# Patient Record
Sex: Female | Born: 1950 | Race: White | Hispanic: No | Smoking: Never smoker
Health system: Southern US, Community
[De-identification: ages and names within clinical notes are randomized; demographics above are authoritative.]

## PROBLEM LIST (undated history)

## (undated) DIAGNOSIS — C801 Malignant (primary) neoplasm, unspecified: Secondary | ICD-10-CM

## (undated) DIAGNOSIS — I10 Essential (primary) hypertension: Secondary | ICD-10-CM

## (undated) HISTORY — PX: BREAST SURGERY: SHX581

## (undated) HISTORY — PX: ABDOMINAL HYSTERECTOMY: SHX81

---

## 2013-01-08 ENCOUNTER — Encounter (HOSPITAL_BASED_OUTPATIENT_CLINIC_OR_DEPARTMENT_OTHER): Payer: Self-pay

## 2013-01-08 ENCOUNTER — Emergency Department (HOSPITAL_BASED_OUTPATIENT_CLINIC_OR_DEPARTMENT_OTHER)
Admission: EM | Admit: 2013-01-08 | Discharge: 2013-01-08 | Disposition: A | Discharge status: Home / Self Care | Payer: BC Managed Care – PPO | Attending: Emergency Medicine | Admitting: Emergency Medicine

## 2013-01-08 DIAGNOSIS — Z79899 Other long term (current) drug therapy: Secondary | ICD-10-CM | POA: Insufficient documentation

## 2013-01-08 DIAGNOSIS — I10 Essential (primary) hypertension: Secondary | ICD-10-CM | POA: Insufficient documentation

## 2013-01-08 DIAGNOSIS — F411 Generalized anxiety disorder: Secondary | ICD-10-CM | POA: Insufficient documentation

## 2013-01-08 DIAGNOSIS — Z85819 Personal history of malignant neoplasm of unspecified site of lip, oral cavity, and pharynx: Secondary | ICD-10-CM | POA: Insufficient documentation

## 2013-01-08 HISTORY — DX: Essential (primary) hypertension: I10

## 2013-01-08 HISTORY — DX: Malignant (primary) neoplasm, unspecified: C80.1

## 2013-01-08 LAB — BASIC METABOLIC PANEL
CO2: 28 mEq/L (ref 19–32)
Calcium: 10.3 mg/dL (ref 8.4–10.5)
Creatinine, Ser: 0.6 mg/dL (ref 0.50–1.10)

## 2013-01-08 LAB — TROPONIN I: Troponin I: <0.3 ng/mL (ref <0.30)

## 2013-01-08 LAB — CBC WITH DIFFERENTIAL/PLATELET
Basophils Absolute: 0 10*3/uL (ref 0.0–0.1)
Basophils Relative: 1 % (ref 0–1)
Eosinophils Relative: 3 % (ref 0–5)
HCT: 39.2 % (ref 36.0–46.0)
MCH: 31.7 pg (ref 26.0–34.0)
MCHC: 34.4 g/dL (ref 30.0–36.0)
MCV: 92 fL (ref 78.0–100.0)
Monocytes Absolute: 0.7 10*3/uL (ref 0.1–1.0)
RDW: 13.5 % (ref 11.5–15.5)

## 2013-01-08 NOTE — ED Notes (Signed)
C/o "not feeling right"-approx 2pm-has checked BP throughout the day with increases in BP-"twinges of something"-denies as pain- in chest-describes as "that didn't feel right"-denies pain at present

## 2013-01-08 NOTE — ED Provider Notes (Signed)
History    CSN: [id number] Arrival date & time 01/08/13  2108  First MD Initiated Contact with Patient 01/08/13 2153     Chief Complaint  Patient presents with  . Hypertension   (Consider location/radiation/quality/duration/timing/severity/associated sxs/prior Treatment) Patient is a 62 y.o. female presenting with hypertension. The history is provided by the patient.  Hypertension This is a new problem. The current episode started 12 to 24 hours ago. The problem occurs constantly. The problem has been gradually worsening. Pertinent negatives include no chest pain, no abdominal pain, no headaches and no shortness of breath. Associated symptoms comments: States she just doesn't feel quite right.  Also states that she started to get really nervous about her BP and due to family hx of cardiac disease she started to feel very anxious.. Nothing aggravates the symptoms. Nothing relieves the symptoms. She has tried nothing for the symptoms. The treatment provided no relief.   Past Medical History  Diagnosis Date  . Hypertension   . Adenoid cystic carcinoma    Past Surgical History  Procedure Laterality Date  . Abdominal hysterectomy     No family history on file. History  Substance Use Topics  . Smoking status: Never Smoker   . Smokeless tobacco: Not on file  . Alcohol Use: No   OB History   Grav Para Term Preterm Abortions TAB SAB Ect Mult Living                 Review of Systems  Constitutional: Negative for fever, chills and diaphoresis.  Respiratory: Negative for cough and shortness of breath.   Cardiovascular: Negative for chest pain.  Gastrointestinal: Negative for nausea, vomiting, abdominal pain and diarrhea.  Genitourinary: Negative for dysuria, frequency and flank pain.  Neurological: Negative for dizziness, speech difficulty, weakness and headaches.  All other systems reviewed and are negative.    Allergies  Review of patient's allergies indicates no known  allergies.  Home Medications   Current Outpatient Rx  Name  Route  Sig  Dispense  Refill  . fish oil-omega-3 fatty acids 1000 MG capsule   Oral   Take 2 g by mouth daily.         . hydrochlorothiazide (HYDRODIURIL) 12.5 MG tablet   Oral   Take 12.5 mg by mouth daily.         Marland Kitchen losartan (COZAAR) 100 MG tablet   Oral   Take 100 mg by mouth daily.         . Multiple Vitamins-Minerals (MULTIVITAMIN PO)   Oral   Take by mouth.          BP 158/86  Pulse 88  Temp(Src) 98.3 F (36.8 C) (Oral)  Resp 18  Ht 5\' 7"  (1.702 m)  Wt 152 lb (68.947 kg)  BMI 23.8 kg/m2  SpO2 99% Physical Exam  Nursing note and vitals reviewed. Constitutional: She is oriented to person, place, and time. She appears well-developed and well-nourished. No distress.  HENT:  Head: Normocephalic and atraumatic.  Mouth/Throat: Oropharynx is clear and moist.  Eyes: Conjunctivae and EOM are normal. Pupils are equal, round, and reactive to light.  Neck: Normal range of motion. Neck supple.  Cardiovascular: Normal rate, regular rhythm and intact distal pulses.   No murmur heard. Pulmonary/Chest: Effort normal and breath sounds normal. No respiratory distress. She has no wheezes. She has no rales.  Abdominal: Soft. She exhibits no distension. There is no tenderness. There is no rebound and no guarding.  Musculoskeletal: Normal range of  motion. She exhibits no edema and no tenderness.  Neurological: She is alert and oriented to person, place, and time.  Skin: Skin is warm and dry. No rash noted. No erythema.  Psychiatric: She has a normal mood and affect. Her behavior is normal.    ED Course  Procedures (including critical care time) Labs Reviewed  BASIC METABOLIC PANEL - Abnormal; Notable for the following:    Glucose, Bld 115 (*)    All other components within normal limits  CBC WITH DIFFERENTIAL  TROPONIN I    Date: 01/08/2013  Rate: 84  Rhythm: normal sinus rhythm  QRS Axis: normal   Intervals: normal  ST/T Wave abnormalities: nonspecific ST changes  Conduction Disutrbances:Incomplete right bundle-branch block  Narrative Interpretation:   Old EKG Reviewed: none available   No results found. 1. Hypertension     MDM   Patient here today due to high blood pressure and states she just doesn't feel right. She denies any chest pain, shortness of breath, nausea, vomiting, headache. She states she cannot put her finger on it but just didn't feel quite right. She states that she really thinks his she is anxious because of her family history of heart issues and her blood pressure being high. She denies any change in her blood pressure medications and no new medications.  Patient is well appearing on exam with a normal physical exam. Vital signs are significant for a blood pressure of 180/94. EKG shows a normal sinus rhythm with an incomplete right bundle branch block without old to compare but no ST depression or T-wave inversion.  All labs are within normal limits including CBC, troponin and BMP. On repeat evaluation patient's blood pressure is 158/86 and heart rate is in the 80s. She states she feels much better just knowing that the tests look all right. She was instructed that if she develops chest pain shortness of breath or other concerning symptoms to return immediately but otherwise will continue to follow her blood pressure follow up with her doctor if it remains high  Gwyneth Sprout, MD 01/08/13 2338

## 2014-07-12 ENCOUNTER — Emergency Department (HOSPITAL_BASED_OUTPATIENT_CLINIC_OR_DEPARTMENT_OTHER)
Admission: EM | Admit: 2014-07-12 | Discharge: 2014-07-12 | Disposition: A | Discharge status: Home / Self Care | Payer: Medicare Other | Attending: Emergency Medicine | Admitting: Emergency Medicine

## 2014-07-12 ENCOUNTER — Encounter (HOSPITAL_BASED_OUTPATIENT_CLINIC_OR_DEPARTMENT_OTHER): Payer: Self-pay | Admitting: Emergency Medicine

## 2014-07-12 ENCOUNTER — Emergency Department (HOSPITAL_BASED_OUTPATIENT_CLINIC_OR_DEPARTMENT_OTHER): Payer: Medicare Other

## 2014-07-12 DIAGNOSIS — R110 Nausea: Secondary | ICD-10-CM | POA: Diagnosis not present

## 2014-07-12 DIAGNOSIS — R0981 Nasal congestion: Secondary | ICD-10-CM | POA: Diagnosis not present

## 2014-07-12 DIAGNOSIS — R000 Tachycardia, unspecified: Secondary | ICD-10-CM | POA: Insufficient documentation

## 2014-07-12 DIAGNOSIS — R0602 Shortness of breath: Secondary | ICD-10-CM | POA: Diagnosis present

## 2014-07-12 DIAGNOSIS — R42 Dizziness and giddiness: Secondary | ICD-10-CM | POA: Diagnosis not present

## 2014-07-12 DIAGNOSIS — Z8500 Personal history of malignant neoplasm of unspecified digestive organ: Secondary | ICD-10-CM | POA: Insufficient documentation

## 2014-07-12 DIAGNOSIS — Z79899 Other long term (current) drug therapy: Secondary | ICD-10-CM | POA: Insufficient documentation

## 2014-07-12 DIAGNOSIS — I10 Essential (primary) hypertension: Secondary | ICD-10-CM | POA: Diagnosis not present

## 2014-07-12 LAB — COMPREHENSIVE METABOLIC PANEL
ALBUMIN: 4.8 g/dL (ref 3.5–5.2)
ALK PHOS: 65 U/L (ref 39–117)
ALT: 21 U/L (ref 0–35)
ANION GAP: 7 (ref 5–15)
AST: 25 U/L (ref 0–37)
BILIRUBIN TOTAL: 0.8 mg/dL (ref 0.3–1.2)
BUN: 12 mg/dL (ref 6–23)
CO2: 28 mmol/L (ref 19–32)
Calcium: 10.1 mg/dL (ref 8.4–10.5)
Chloride: 103 mEq/L (ref 96–112)
Creatinine, Ser: 0.55 mg/dL (ref 0.50–1.10)
GFR calc Af Amer: >90 mL/min (ref >90)
GFR calc non Af Amer: >90 mL/min (ref >90)
Glucose, Bld: 104 mg/dL — ABNORMAL HIGH (ref 70–99)
POTASSIUM: 3.5 mmol/L (ref 3.5–5.1)
SODIUM: 138 mmol/L (ref 135–145)
Total Protein: 7.9 g/dL (ref 6.0–8.3)

## 2014-07-12 LAB — D-DIMER, QUANTITATIVE (NOT AT ARMC)

## 2014-07-12 LAB — CBC WITH DIFFERENTIAL/PLATELET
BASOS PCT: 1 % (ref 0–1)
Basophils Absolute: 0.1 10*3/uL (ref 0.0–0.1)
Eosinophils Absolute: 0.1 10*3/uL (ref 0.0–0.7)
Eosinophils Relative: 2 % (ref 0–5)
HCT: 41.7 % (ref 36.0–46.0)
Hemoglobin: 13.9 g/dL (ref 12.0–15.0)
Lymphocytes Relative: 22 % (ref 12–46)
Lymphs Abs: 1.6 10*3/uL (ref 0.7–4.0)
MCH: 30.8 pg (ref 26.0–34.0)
MCHC: 33.3 g/dL (ref 30.0–36.0)
MCV: 92.5 fL (ref 78.0–100.0)
MONO ABS: 0.4 10*3/uL (ref 0.1–1.0)
Monocytes Relative: 6 % (ref 3–12)
NEUTROS ABS: 5.3 10*3/uL (ref 1.7–7.7)
NEUTROS PCT: 69 % (ref 43–77)
PLATELETS: 174 10*3/uL (ref 150–400)
RBC: 4.51 MIL/uL (ref 3.87–5.11)
RDW: 13.6 % (ref 11.5–15.5)
WBC: 7.5 10*3/uL (ref 4.0–10.5)

## 2014-07-12 LAB — BRAIN NATRIURETIC PEPTIDE: B Natriuretic Peptide: 19.3 pg/mL (ref 0.0–100.0)

## 2014-07-12 LAB — TROPONIN I: Troponin I: <0.03 ng/mL (ref <0.031)

## 2014-07-12 MED ORDER — ONDANSETRON HCL 4 MG/2ML IJ SOLN
4.0000 mg | Freq: Once | INTRAMUSCULAR | Status: DC
  Filled 2014-07-12: qty 2

## 2014-07-12 MED ORDER — SODIUM CHLORIDE 0.9 % IV SOLN
INTRAVENOUS | Status: DC
  Administered 2014-07-12: 17:00:00 via INTRAVENOUS

## 2014-07-12 NOTE — ED Notes (Signed)
Pt presents to ED with complaints of shortness of breath for a couple days with activity.

## 2014-07-12 NOTE — ED Provider Notes (Signed)
CSN: [id number]     Arrival date & time 07/12/14  1502 History  This chart was scribed for Fredia Sorrow, MD by Randa Evens, ED Scribe. This patient was seen in room MH02/MH02 and the patient's care was started at 4:30 PM.      Chief Complaint  Patient presents with  . Shortness of Breath   Patient is a 64 y.o. female presenting with shortness of breath. The history is provided by the patient. No language interpreter was used.  Shortness of Breath Severity:  Mild Onset quality:  Gradual Duration:  3 days Timing:  Intermittent Context: activity   Worsened by:  Exertion Associated symptoms: no abdominal pain, no chest pain, no cough, no fever, no headaches, no neck pain, no rash, no sore throat and no vomiting    HPI Comments: Julie Wise is a 64 y.o. female who presents to the Emergency Department complaining of SOB onset 3 days prior. Pt states that its worse with exertion. Pt she has had some associated nausea, dizziness and light headedness. Pt states she recently seen her PCP 1 day ago. Pt states she was prescribed Antivert due to being diagnosed with vertigo. Pt denies CP, abdominal, fever or chills    PCP Dr. Daryel Gerald  Past Medical History  Diagnosis Date  . Hypertension   . Adenoid cystic carcinoma    Past Surgical History  Procedure Laterality Date  . Abdominal hysterectomy     No family history on file. History  Substance Use Topics  . Smoking status: Never Smoker   . Smokeless tobacco: Not on file  . Alcohol Use: No   OB History    No data available     Review of Systems  Constitutional: Negative for fever and chills.  HENT: Positive for congestion. Negative for rhinorrhea and sore throat.   Eyes: Negative for visual disturbance.  Respiratory: Positive for shortness of breath. Negative for cough.   Cardiovascular: Negative for chest pain and leg swelling.  Gastrointestinal: Positive for nausea. Negative for vomiting, abdominal pain and diarrhea.   Genitourinary: Negative for dysuria.  Musculoskeletal: Negative for back pain and neck pain.  Skin: Negative for rash.  Neurological: Positive for dizziness and light-headedness. Negative for headaches.  Psychiatric/Behavioral: Negative for confusion.  All other systems reviewed and are negative.     Allergies  Review of patient's allergies indicates no known allergies.  Home Medications   Prior to Admission medications   Medication Sig Start Date End Date Taking? Authorizing Provider  fish oil-omega-3 fatty acids 1000 MG capsule Take 2 g by mouth daily.    Historical Provider, MD  hydrochlorothiazide (HYDRODIURIL) 12.5 MG tablet Take 12.5 mg by mouth daily.    Historical Provider, MD  losartan (COZAAR) 100 MG tablet Take 100 mg by mouth daily.    Historical Provider, MD  Multiple Vitamins-Minerals (MULTIVITAMIN PO) Take by mouth.    Historical Provider, MD   BP 172/84 mmHg  Pulse 104  Temp(Src) 98.3 F (36.8 C) (Oral)  Resp 18  Ht 5\' 7"  (1.702 m)  Wt 160 lb (72.576 kg)  BMI 25.05 kg/m2  SpO2 100%   Physical Exam  Constitutional: She is oriented to person, place, and time. She appears well-developed and well-nourished. No distress.  HENT:  Head: Normocephalic and atraumatic.  Eyes: Conjunctivae and EOM are normal.  Neck: Neck supple. No tracheal deviation present.  Cardiovascular: Regular rhythm.  Tachycardia present.   No murmur heard. Pulmonary/Chest: Effort normal and breath sounds normal. No respiratory distress.  She has no wheezes. She has no rales.  Abdominal: Soft. Bowel sounds are normal. There is no tenderness.  Musculoskeletal: Normal range of motion.  Neurological: She is alert and oriented to person, place, and time. No cranial nerve deficit.  Skin: Skin is warm and dry.  Psychiatric: She has a normal mood and affect. Her behavior is normal.  Nursing note and vitals reviewed.   ED Course  Procedures (including critical care time) DIAGNOSTIC  STUDIES: Oxygen Saturation is 100% on RA, normal by my interpretation.    COORDINATION OF CARE: 4:40 PM-Discussed treatment plan with pt at bedside and pt agreed to plan.     Labs Review Labs Reviewed  COMPREHENSIVE METABOLIC PANEL - Abnormal; Notable for the following:    Glucose, Bld 104 (*)    All other components within normal limits  BRAIN NATRIURETIC PEPTIDE  D-DIMER, QUANTITATIVE  CBC WITH DIFFERENTIAL  TROPONIN I   Results for orders placed or performed during the hospital encounter of 07/12/14  Brain natriuretic peptide  Result Value Ref Range   B Natriuretic Peptide 19.3 0.0 - 100.0 pg/mL  D-dimer, quantitative  Result Value Ref Range   D-Dimer, Quant <0.27 0.00 - 0.48 ug/mL-FEU  Comprehensive metabolic panel  Result Value Ref Range   Sodium 138 135 - 145 mmol/L   Potassium 3.5 3.5 - 5.1 mmol/L   Chloride 103 96 - 112 mEq/L   CO2 28 19 - 32 mmol/L   Glucose, Bld 104 (H) 70 - 99 mg/dL   BUN 12 6 - 23 mg/dL   Creatinine, Ser 0.55 0.50 - 1.10 mg/dL   Calcium 10.1 8.4 - 10.5 mg/dL   Total Protein 7.9 6.0 - 8.3 g/dL   Albumin 4.8 3.5 - 5.2 g/dL   AST 25 0 - 37 U/L   ALT 21 0 - 35 U/L   Alkaline Phosphatase 65 39 - 117 U/L   Total Bilirubin 0.8 0.3 - 1.2 mg/dL   GFR calc non Af Amer >90 >90 mL/min   GFR calc Af Amer >90 >90 mL/min   Anion gap 7 5 - 15  CBC with Differential  Result Value Ref Range   WBC 7.5 4.0 - 10.5 K/uL   RBC 4.51 3.87 - 5.11 MIL/uL   Hemoglobin 13.9 12.0 - 15.0 g/dL   HCT 41.7 36.0 - 46.0 %   MCV 92.5 78.0 - 100.0 fL   MCH 30.8 26.0 - 34.0 pg   MCHC 33.3 30.0 - 36.0 g/dL   RDW 13.6 11.5 - 15.5 %   Platelets 174 150 - 400 K/uL   Neutrophils Relative % 69 43 - 77 %   Neutro Abs 5.3 1.7 - 7.7 K/uL   Lymphocytes Relative 22 12 - 46 %   Lymphs Abs 1.6 0.7 - 4.0 K/uL   Monocytes Relative 6 3 - 12 %   Monocytes Absolute 0.4 0.1 - 1.0 K/uL   Eosinophils Relative 2 0 - 5 %   Eosinophils Absolute 0.1 0.0 - 0.7 K/uL   Basophils Relative 1 0  - 1 %   Basophils Absolute 0.1 0.0 - 0.1 K/uL  Troponin I  Result Value Ref Range   Troponin I <0.03 <0.031 ng/mL     Imaging Review Dg Chest 2 View  07/12/2014   CLINICAL DATA:  Shortness of breath for 3 days, worse with exertion.  EXAM: CHEST  2 VIEW  COMPARISON:  None.  FINDINGS: The lungs are clear. Heart size is normal. There is no pneumothorax or  pleural effusion. No focal bony abnormality is identified with mild appearing thoracic spondylosis noted.  IMPRESSION: No acute disease.   Electronically Signed   By: Inge Rise M.D.   On: 07/12/2014 17:12     EKG Interpretation   Date/Time:  Saturday July 12 2014 16:52:46 EST Ventricular Rate:  77 PR Interval:  156 QRS Duration: 84 QT Interval:  404 QTC Calculation: 457 R Axis:   81 Text Interpretation:  Normal sinus rhythm Nonspecific ST abnormality  Abnormal ECG No significant change since last tracing Confirmed by  Garrin Kirwan  MD, Jessly Lebeck ([id number]) on 07/12/2014 4:56:42 PM      MDM   Final diagnoses:  SOB (shortness of breath)    Patient with thorough workup for exertional shortness of breath without any significant findings. Chest x-rays negative for pneumonia pulmonary edema or pneumothorax. Patient's d-dimer is negative making pulmonary embolus highly unlikely. Troponin was negative EKG without acute changes. No evidence of an acute cardiac event. Patient was somewhat hypertensive here patient was originally tachycardic but on EKG patient's heart rate was 77. Follow-up for the blood pressure with her primary care doctor would be recommended.  I personally performed the services described in this documentation, which was scribed in my presence. The recorded information has been reviewed and is accurate.       Fredia Sorrow, MD 07/12/14 1816

## 2014-07-12 NOTE — Discharge Instructions (Signed)
Workup without any significant findings. Would recommend follow-up with your regular doctor in the next few days. Typically for blood pressure recheck. Return for any new or worse symptoms.

## 2015-01-06 ENCOUNTER — Encounter (HOSPITAL_BASED_OUTPATIENT_CLINIC_OR_DEPARTMENT_OTHER): Payer: Self-pay | Admitting: *Deleted

## 2015-01-06 ENCOUNTER — Emergency Department (HOSPITAL_BASED_OUTPATIENT_CLINIC_OR_DEPARTMENT_OTHER): Payer: Medicare Other

## 2015-01-06 ENCOUNTER — Emergency Department (HOSPITAL_BASED_OUTPATIENT_CLINIC_OR_DEPARTMENT_OTHER)
Admission: EM | Admit: 2015-01-06 | Discharge: 2015-01-06 | Disposition: A | Discharge status: Home / Self Care | Payer: Medicare Other | Attending: Emergency Medicine | Admitting: Emergency Medicine

## 2015-01-06 DIAGNOSIS — Z8589 Personal history of malignant neoplasm of other organs and systems: Secondary | ICD-10-CM | POA: Diagnosis not present

## 2015-01-06 DIAGNOSIS — Z79899 Other long term (current) drug therapy: Secondary | ICD-10-CM | POA: Insufficient documentation

## 2015-01-06 DIAGNOSIS — R110 Nausea: Secondary | ICD-10-CM | POA: Insufficient documentation

## 2015-01-06 DIAGNOSIS — I10 Essential (primary) hypertension: Secondary | ICD-10-CM | POA: Diagnosis not present

## 2015-01-06 DIAGNOSIS — R42 Dizziness and giddiness: Secondary | ICD-10-CM | POA: Diagnosis present

## 2015-01-06 LAB — BASIC METABOLIC PANEL
Anion gap: 9 (ref 5–15)
BUN: 14 mg/dL (ref 6–20)
CHLORIDE: 104 mmol/L (ref 101–111)
CO2: 26 mmol/L (ref 22–32)
Calcium: 9.4 mg/dL (ref 8.9–10.3)
Creatinine, Ser: 0.64 mg/dL (ref 0.44–1.00)
Glucose, Bld: 123 mg/dL — ABNORMAL HIGH (ref 65–99)
Potassium: 3.3 mmol/L — ABNORMAL LOW (ref 3.5–5.1)
SODIUM: 139 mmol/L (ref 135–145)

## 2015-01-06 LAB — CBC WITH DIFFERENTIAL/PLATELET
Basophils Absolute: 0 10*3/uL (ref 0.0–0.1)
Basophils Relative: 1 % (ref 0–1)
EOS PCT: 3 % (ref 0–5)
Eosinophils Absolute: 0.2 10*3/uL (ref 0.0–0.7)
HEMATOCRIT: 37 % (ref 36.0–46.0)
HEMOGLOBIN: 12.6 g/dL (ref 12.0–15.0)
LYMPHS ABS: 2 10*3/uL (ref 0.7–4.0)
LYMPHS PCT: 30 % (ref 12–46)
MCH: 31.6 pg (ref 26.0–34.0)
MCHC: 34.1 g/dL (ref 30.0–36.0)
MCV: 92.7 fL (ref 78.0–100.0)
MONOS PCT: 8 % (ref 3–12)
Monocytes Absolute: 0.5 10*3/uL (ref 0.1–1.0)
NEUTROS PCT: 58 % (ref 43–77)
Neutro Abs: 4.1 10*3/uL (ref 1.7–7.7)
PLATELETS: 134 10*3/uL — AB (ref 150–400)
RBC: 3.99 MIL/uL (ref 3.87–5.11)
RDW: 13.3 % (ref 11.5–15.5)
WBC: 6.9 10*3/uL (ref 4.0–10.5)

## 2015-01-06 MED ORDER — MECLIZINE HCL 25 MG PO TABS
25.0000 mg | ORAL_TABLET | Freq: Once | ORAL | Status: AC
  Administered 2015-01-06: 25 mg via ORAL
  Filled 2015-01-06: qty 1

## 2015-01-06 MED ORDER — MECLIZINE HCL 25 MG PO TABS
25.0000 mg | ORAL_TABLET | Freq: Three times a day (TID) | ORAL | Status: AC | PRN
Start: 2015-01-06 — End: ?

## 2015-01-06 NOTE — ED Provider Notes (Signed)
CSN: [id number]     Arrival date & time 01/06/15  2005 History  This chart was scribed for Veryl Speak, MD by Chester Holstein, ED Scribe. This patient was seen in room MH06/MH06 and the patient's care was started at 8:28 PM.    Chief Complaint  Patient presents with  . Dizziness     The history is provided by the patient. No language interpreter was used.   HPI Comments: Julie Wise is a 64 y.o. female with PMHx of HTN and adenoid cystic carcinoma who presents to the Emergency Department complaining of waxing and waning dizziness with onset 2 days ago. Pt notes associated nausea. She states standing . Pt has tried Antivert with no relief. She has been complaint with her BP medication. Pt denies vomiting, lightheadedness, numbness, and weakness.   Past Medical History  Diagnosis Date  . Hypertension   . Adenoid cystic carcinoma    Past Surgical History  Procedure Laterality Date  . Abdominal hysterectomy    . Breast surgery     History reviewed. No pertinent family history. History  Substance Use Topics  . Smoking status: Never Smoker   . Smokeless tobacco: Not on file  . Alcohol Use: No   OB History    No data available     Review of Systems  Neurological: Positive for dizziness.   A complete 10 system review of systems was obtained and all systems are negative except as noted in the HPI and PMH.     Allergies  Review of patient's allergies indicates no known allergies.  Home Medications   Prior to Admission medications   Medication Sig Start Date End Date Taking? Authorizing Provider  fish oil-omega-3 fatty acids 1000 MG capsule Take 2 g by mouth daily.    Historical Provider, MD  hydrochlorothiazide (HYDRODIURIL) 12.5 MG tablet Take 12.5 mg by mouth daily.    Historical Provider, MD  losartan (COZAAR) 100 MG tablet Take 100 mg by mouth daily.    Historical Provider, MD  Multiple Vitamins-Minerals (MULTIVITAMIN PO) Take by mouth.    Historical Provider, MD    BP 183/95 mmHg  Pulse 89  Temp(Src) 98.1 F (36.7 C) (Oral)  Resp 18  Ht 5\' 7"  (1.702 m)  Wt 160 lb (72.576 kg)  BMI 25.05 kg/m2  SpO2 99% Physical Exam  Constitutional: She is oriented to person, place, and time. She appears well-developed and well-nourished. No distress.  HENT:  Head: Normocephalic and atraumatic.  Eyes: EOM are normal. Pupils are equal, round, and reactive to light.  Neck: Normal range of motion.  Cardiovascular: Normal rate, regular rhythm and normal heart sounds.   Pulmonary/Chest: Effort normal and breath sounds normal.  Abdominal: Soft. She exhibits no distension. There is no tenderness.  Musculoskeletal: Normal range of motion.  Neurological: She is alert and oriented to person, place, and time. She has normal strength. No cranial nerve deficit or sensory deficit. Coordination normal.  Skin: Skin is warm and dry.  Psychiatric: She has a normal mood and affect. Judgment normal.  Nursing note and vitals reviewed.   ED Course  Procedures (including critical care time) DIAGNOSTIC STUDIES: Oxygen Saturation is 99% on room air, normal by my interpretation.    COORDINATION OF CARE: 8:35 PM Discussed treatment plan with patient at beside, the patient agrees with the plan and has no further questions at this time.   Labs Review Labs Reviewed - No data to display  Imaging Review No results found.   EKG Interpretation  None      MDM   Final diagnoses:  None    Patient presents with complaints of dizziness that seems to be related to vertigo.  She is feeling better with meclizine.  Labs and ct are reassuring.  Will discharge to home with meclizine and prn return.   I personally performed the services described in this documentation, which was scribed in my presence. The recorded information has been reviewed and is accurate.      Veryl Speak, MD 01/09/15 [phone]

## 2015-01-06 NOTE — ED Notes (Signed)
Pt c/o dizziness x 3 days with nausea

## 2015-01-06 NOTE — Discharge Instructions (Signed)
Meclizine 25 mg every 6 hours as needed for dizziness.  Return to the emergency department if symptoms significantly worsen or change.   Vertigo Vertigo means you feel like you or your surroundings are moving when they are not. Vertigo can be dangerous if it occurs when you are at work, driving, or performing difficult activities.  CAUSES  Vertigo occurs when there is a conflict of signals sent to your brain from the visual and sensory systems in your body. There are many different causes of vertigo, including:  Infections, especially in the inner ear.  A bad reaction to a drug or misuse of alcohol and medicines.  Withdrawal from drugs or alcohol.  Rapidly changing positions, such as lying down or rolling over in bed.  A migraine headache.  Decreased blood flow to the brain.  Increased pressure in the brain from a head injury, infection, tumor, or bleeding. SYMPTOMS  You may feel as though the world is spinning around or you are falling to the ground. Because your balance is upset, vertigo can cause nausea and vomiting. You may have involuntary eye movements (nystagmus). DIAGNOSIS  Vertigo is usually diagnosed by physical exam. If the cause of your vertigo is unknown, your caregiver may perform imaging tests, such as an MRI scan (magnetic resonance imaging). TREATMENT  Most cases of vertigo resolve on their own, without treatment. Depending on the cause, your caregiver may prescribe certain medicines. If your vertigo is related to body position issues, your caregiver may recommend movements or procedures to correct the problem. In rare cases, if your vertigo is caused by certain inner ear problems, you may need surgery. HOME CARE INSTRUCTIONS   Follow your caregiver's instructions.  Avoid driving.  Avoid operating heavy machinery.  Avoid performing any tasks that would be dangerous to you or others during a vertigo episode.  Tell your caregiver if you notice that certain  medicines seem to be causing your vertigo. Some of the medicines used to treat vertigo episodes can actually make them worse in some people. SEEK IMMEDIATE MEDICAL CARE IF:   Your medicines do not relieve your vertigo or are making it worse.  You develop problems with talking, walking, weakness, or using your arms, hands, or legs.  You develop severe headaches.  Your nausea or vomiting continues or gets worse.  You develop visual changes.  A family member notices behavioral changes.  Your condition gets worse. MAKE SURE YOU:  Understand these instructions.  Will watch your condition.  Will get help right away if you are not doing well or get worse. Document Released: 03/23/2005 Document Revised: 09/05/2011 Document Reviewed: 12/30/2010 [facility] Patient Information 2015 [place], [place]. This information is not intended to replace advice given to you by your health care provider. Make sure you discuss any questions you have with your health care provider.

## 2015-08-09 ENCOUNTER — Emergency Department (HOSPITAL_BASED_OUTPATIENT_CLINIC_OR_DEPARTMENT_OTHER)
Admission: EM | Admit: 2015-08-09 | Discharge: 2015-08-09 | Disposition: A | Discharge status: Home / Self Care | Payer: Medicare Other | Attending: Emergency Medicine | Admitting: Emergency Medicine

## 2015-08-09 ENCOUNTER — Encounter (HOSPITAL_BASED_OUTPATIENT_CLINIC_OR_DEPARTMENT_OTHER): Payer: Self-pay | Admitting: Emergency Medicine

## 2015-08-09 DIAGNOSIS — R110 Nausea: Secondary | ICD-10-CM | POA: Insufficient documentation

## 2015-08-09 DIAGNOSIS — I10 Essential (primary) hypertension: Secondary | ICD-10-CM | POA: Insufficient documentation

## 2015-08-09 DIAGNOSIS — Z79899 Other long term (current) drug therapy: Secondary | ICD-10-CM | POA: Insufficient documentation

## 2015-08-09 DIAGNOSIS — Z85858 Personal history of malignant neoplasm of other endocrine glands: Secondary | ICD-10-CM | POA: Diagnosis not present

## 2015-08-09 DIAGNOSIS — R42 Dizziness and giddiness: Secondary | ICD-10-CM | POA: Diagnosis not present

## 2015-08-09 DIAGNOSIS — H5509 Other forms of nystagmus: Secondary | ICD-10-CM | POA: Diagnosis not present

## 2015-08-09 LAB — BASIC METABOLIC PANEL
ANION GAP: 10 (ref 5–15)
BUN: 12 mg/dL (ref 6–20)
CALCIUM: 9.7 mg/dL (ref 8.9–10.3)
CO2: 27 mmol/L (ref 22–32)
Chloride: 103 mmol/L (ref 101–111)
Creatinine, Ser: 0.6 mg/dL (ref 0.44–1.00)
GFR calc Af Amer: >60 mL/min (ref >60)
GLUCOSE: 125 mg/dL — AB (ref 65–99)
Potassium: 3.2 mmol/L — ABNORMAL LOW (ref 3.5–5.1)
SODIUM: 140 mmol/L (ref 135–145)

## 2015-08-09 LAB — CBC
HCT: 40.5 % (ref 36.0–46.0)
Hemoglobin: 13.9 g/dL (ref 12.0–15.0)
MCH: 31.2 pg (ref 26.0–34.0)
MCHC: 34.3 g/dL (ref 30.0–36.0)
MCV: 90.8 fL (ref 78.0–100.0)
PLATELETS: 164 10*3/uL (ref 150–400)
RBC: 4.46 MIL/uL (ref 3.87–5.11)
RDW: 13.3 % (ref 11.5–15.5)
WBC: 8.1 10*3/uL (ref 4.0–10.5)

## 2015-08-09 LAB — URINALYSIS, ROUTINE W REFLEX MICROSCOPIC
Bilirubin Urine: NEGATIVE
GLUCOSE, UA: NEGATIVE mg/dL
KETONES UR: NEGATIVE mg/dL
LEUKOCYTES UA: NEGATIVE
Nitrite: NEGATIVE
PROTEIN: NEGATIVE mg/dL
Specific Gravity, Urine: 1.015 (ref 1.005–1.030)
pH: 6 (ref 5.0–8.0)

## 2015-08-09 LAB — URINE MICROSCOPIC-ADD ON

## 2015-08-09 MED ORDER — POTASSIUM CHLORIDE CRYS ER 20 MEQ PO TBCR
40.0000 meq | EXTENDED_RELEASE_TABLET | Freq: Once | ORAL | Status: AC
  Administered 2015-08-09: 40 meq via ORAL
  Filled 2015-08-09: qty 2

## 2015-08-09 MED ORDER — MECLIZINE HCL 25 MG PO TABS: 25.0000 mg | ORAL_TABLET | Freq: Three times a day (TID) | ORAL | Status: AC | PRN

## 2015-08-09 MED ORDER — MECLIZINE HCL 25 MG PO TABS
25.0000 mg | ORAL_TABLET | Freq: Once | ORAL | Status: AC
  Administered 2015-08-09: 25 mg via ORAL
  Filled 2015-08-09: qty 1

## 2015-08-09 NOTE — Discharge Instructions (Signed)
Schedule follow up appointment with your primary care provider in the next 2-3 days.   Benign Positional Vertigo Vertigo is the feeling that you or your surroundings are moving when they are not. Benign positional vertigo is the most common form of vertigo. The cause of this condition is not serious (is benign). This condition is triggered by certain movements and positions (is positional). This condition can be dangerous if it occurs while you are doing something that could endanger you or others, such as driving.  CAUSES In many cases, the cause of this condition is not known. It may be caused by a disturbance in an area of the inner ear that helps your brain to sense movement and balance. This disturbance can be caused by a viral infection (labyrinthitis), head injury, or repetitive motion. RISK FACTORS This condition is more likely to develop in:  Women.  People who are 66 years of age or older. SYMPTOMS Symptoms of this condition usually happen when you move your head or your eyes in different directions. Symptoms may start suddenly, and they usually last for less than a minute. Symptoms may include:  Loss of balance and falling.  Feeling like you are spinning or moving.  Feeling like your surroundings are spinning or moving.  Nausea and vomiting.  Blurred vision.  Dizziness.  Involuntary eye movement (nystagmus). Symptoms can be mild and cause only slight annoyance, or they can be severe and interfere with daily life. Episodes of benign positional vertigo may return (recur) over time, and they may be triggered by certain movements. Symptoms may improve over time. DIAGNOSIS This condition is usually diagnosed by medical history and a physical exam of the head, neck, and ears. You may be referred to a health care provider who specializes in ear, nose, and throat (ENT) problems (otolaryngologist) or a provider who specializes in disorders of the nervous system (neurologist). You may  have additional testing, including:  MRI.  A CT scan.  Eye movement tests. Your health care provider may ask you to change positions quickly while he or she watches you for symptoms of benign positional vertigo, such as nystagmus. Eye movement may be tested with an electronystagmogram (ENG), caloric stimulation, the Dix-Hallpike test, or the roll test.  An electroencephalogram (EEG). This records electrical activity in your brain.  Hearing tests. TREATMENT Usually, your health care provider will treat this by moving your head in specific positions to adjust your inner ear back to normal. Surgery may be needed in severe cases, but this is rare. In some cases, benign positional vertigo may resolve on its own in 2-4 weeks. HOME CARE INSTRUCTIONS Safety  Move slowly.Avoid sudden body or head movements.  Avoid driving.  Avoid operating heavy machinery.  Avoid doing any tasks that would be dangerous to you or others if a vertigo episode would occur.  If you have trouble walking or keeping your balance, try using a cane for stability. If you feel dizzy or unstable, sit down right away.  Return to your normal activities as told by your health care provider. Ask your health care provider what activities are safe for you. General Instructions  Take over-the-counter and prescription medicines only as told by your health care provider.  Avoid certain positions or movements as told by your health care provider.  Drink enough fluid to keep your urine clear or pale yellow.  Keep all follow-up visits as told by your health care provider. This is important. SEEK MEDICAL CARE IF:  You have a  fever.  Your condition gets worse or you develop new symptoms.  Your family or friends notice any behavioral changes.  Your nausea or vomiting gets worse.  You have numbness or a "pins and needles" sensation. SEEK IMMEDIATE MEDICAL CARE IF:  You have difficulty speaking or moving.  You are  always dizzy.  You faint.  You develop severe headaches.  You have weakness in your legs or arms.  You have changes in your hearing or vision.  You develop a stiff neck.  You develop sensitivity to light.   This information is not intended to replace advice given to you by your health care provider. Make sure you discuss any questions you have with your health care provider.   Document Released: 03/21/2006 Document Revised: 03/04/2015 Document Reviewed: 10/06/2014 Elsevier Interactive Patient Education [employer].

## 2015-08-09 NOTE — ED Provider Notes (Signed)
Medical screening examination/treatment/procedure(s) were conducted as a shared visit with non-physician practitioner(s) and myself.  I personally evaluated the patient during the encounter.   EKG Interpretation None     65 y.o. female presents with typical vertigo. Head impulse testing with left gaze nystagmus reproducible. 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 Plan to follow up with PCP as needed and return precautions discussed for worsening or new concerning symptoms.   See related encounter note   Leo Grosser, MD 08/10/15 [phone]

## 2015-08-09 NOTE — ED Provider Notes (Signed)
CSN: [id number]     Arrival date & time 08/09/15  1856 History   First MD Initiated Contact with Patient 08/09/15 2151     Chief Complaint  Patient presents with  . Dizziness   HPI  Julie Wise is a 65 year old female with PMHx of HTN and vertigo presenting with dizziness. She reports a history of vertigo and states that she gets it every winter and every summer. She states that this feels similar but she has not had an episode in over 6 months and wanted to come get checked out. She states that she has had this sensation for the past 4-5 days. She describes it as a "wishy-washy "feeling in her head. She states it feels like her head is moving slower than the rest of her body. She is not currently experiencing the sensation. She states that she feels the most strongly when she comes to a stop after moving. She confirms that this is similar to her other episodes of vertigo. She denies any new symptoms with this current episode. She has taken Dramamine for associated nausea. She has been prescribed meclizine in the past but did not have any remaining. She had a PCP appointment 4 days ago to discuss these symptoms. Her doctor drew blood for lab work that she is unsure with the results. Her doctor did not prescribe meclizine at that time and told her to follow-up in the next week. Denies fever, chills, headache, visual disturbances, syncope, neck pain, chest pain, shortness of breath, palpitations, weakness of the extremities, facial droop, speech slurred, numbness of the extremities or gait instability.  Chart review shows she was seen July 2016 in this emergency department for similar symptoms. She was diagnosed with vertigo and discharged with meclizine.  Past Medical History  Diagnosis Date  . Hypertension   . 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 Abdominal hysterectomy    . Breast surgery     History reviewed. No pertinent family history. Social  History  Substance Use Topics  . Smoking status: Never Smoker   . Smokeless tobacco: None  . Alcohol Use: No   OB History    No data available     Review of Systems  Gastrointestinal: Positive for nausea.  Neurological: Positive for dizziness.  All other systems reviewed and are negative.     Allergies  Review of patient's allergies indicates no known allergies.  Home Medications   Prior to Admission medications   Medication Sig Start Date End Date Taking? Authorizing Provider  metoprolol tartrate (LOPRESSOR) 25 MG tablet Take 25 mg by mouth 2 (two) times daily.   Yes Historical Provider, MD  fish oil-omega-3 fatty acids 1000 MG capsule Take 2 g by mouth daily.    Historical Provider, MD  hydrochlorothiazide (HYDRODIURIL) 12.5 MG tablet Take 12.5 mg by mouth daily.    Historical Provider, MD  losartan (COZAAR) 100 MG tablet Take 100 mg by mouth daily.    Historical Provider, MD  meclizine (ANTIVERT) 25 MG tablet Take 1 tablet (25 mg total) by mouth 3 (three) times daily as needed for dizziness. 01/06/15   Veryl Speak, MD  meclizine (ANTIVERT) 25 MG tablet Take 1 tablet (25 mg total) by mouth 3 (three) times daily as needed for dizziness. 08/09/15   Garnette Greb, PA-C  Multiple Vitamins-Minerals (MULTIVITAMIN PO) Take by mouth.    Historical Provider, MD   BP 151/83 mmHg  Pulse 67  Temp(Src)  98.3 F (36.8 C) (Oral)  Resp 18  Ht 5\' 7"  (1.702 m)  Wt 73.483 kg  BMI 25.37 kg/m2  SpO2 98% Physical Exam  Constitutional: She appears well-developed and well-nourished. No distress.  HENT:  Head: Normocephalic and atraumatic.  Mouth/Throat: Oropharynx is clear and moist. No oropharyngeal exudate.  Eyes: Conjunctivae and EOM are normal. Pupils are equal, round, and reactive to light. Right eye exhibits no discharge. Left eye exhibits no discharge. No scleral icterus.  Horizontal nystagmus  Neck: Normal range of motion. Neck supple. No rigidity.  Cardiovascular: Normal rate,  regular rhythm and normal heart sounds.   Pulmonary/Chest: Effort normal and breath sounds normal. No respiratory distress.  Abdominal: Soft. There is no tenderness. There is no rebound and no guarding.  Musculoskeletal: Normal range of motion.  Neurological: She is alert. No cranial nerve deficit. She exhibits normal muscle tone. Coordination normal.  Cranial nerves 3-12 tested and intact. 5/5 strength of all major muscle groups. Sensation to light touch intact throughout. Finger to nose coordinated. Walks with a steady gait unassisted.   Skin: Skin is warm and dry.  Psychiatric: She has a normal mood and affect. Her behavior is normal.  Nursing note and vitals reviewed.   ED Course  Procedures (including critical care time) Labs Review Labs Reviewed  BASIC METABOLIC PANEL - Abnormal; Notable for the following:    Potassium 3.2 (*)    Glucose, Bld 125 (*)    All other components within normal limits  URINALYSIS, ROUTINE W REFLEX MICROSCOPIC (NOT AT [facility]) - Abnormal; Notable for the following:    Hgb urine dipstick MODERATE (*)    All other components within normal limits  URINE MICROSCOPIC-ADD ON - Abnormal; Notable for the following:    Squamous Epithelial / LPF 0-5 (*)    Bacteria, UA FEW (*)    All other components within normal limits  CBC    Imaging Review No results found. I have personally reviewed and evaluated these images and lab results as part of my medical decision-making.   EKG Interpretation None      MDM   Final diagnoses:  Vertigo   65 year old female with past medical history of vertigo presenting with 4 days of dizziness. She confirms these are similar to previous episodes of vertigo. Patient initially hypertensive in triage,  136/87 during my evaluation of the patient. Patient with unilateral nystagmus and normal neurologic exam. No vertical or rotational nystagmus. Patient normal finger-nose and normal gait.  No slurred speech on a facial droop or  weakness. Doubt CVA or other central cause of vertigo.  History and physical consistent with peripheral vertigo symptoms.  We'll discharge home with meclizine. Patient instructed to followup with her primary care physician within 3 days for further evaluation. They are to return to the emergency department for new neurologic symptoms, loss of vision or other concerning symptoms. Return precautions given in discharge paperwork and discussed with pt at bedside. Pt stable for discharge     Josephina Gip, PA-C 08/09/15 2343  Leo Grosser, MD 08/10/15 [phone]

## 2015-08-09 NOTE — ED Notes (Signed)
Assumed care of patient from [place], [place]. VSS. No distress. Call bell within reach. Awaiting disposition from EDP.

## 2015-08-09 NOTE — ED Notes (Signed)
Patient states that for the last week she has had a "wishy" feeling in her head. Went to her MD on Thursday for the same, and blood was drawn. Has not heard anything, but today the dizziness has worsened.

## 2017-03-16 ENCOUNTER — Encounter (HOSPITAL_BASED_OUTPATIENT_CLINIC_OR_DEPARTMENT_OTHER): Payer: Self-pay | Admitting: Emergency Medicine

## 2017-03-16 ENCOUNTER — Emergency Department (HOSPITAL_BASED_OUTPATIENT_CLINIC_OR_DEPARTMENT_OTHER)
Admission: EM | Admit: 2017-03-16 | Discharge: 2017-03-16 | Disposition: A | Discharge status: Home / Self Care | Payer: Medicare Other | Attending: Emergency Medicine | Admitting: Emergency Medicine

## 2017-03-16 DIAGNOSIS — Z859 Personal history of malignant neoplasm, unspecified: Secondary | ICD-10-CM | POA: Diagnosis not present

## 2017-03-16 DIAGNOSIS — R002 Palpitations: Secondary | ICD-10-CM | POA: Diagnosis not present

## 2017-03-16 DIAGNOSIS — Z79899 Other long term (current) drug therapy: Secondary | ICD-10-CM | POA: Diagnosis not present

## 2017-03-16 DIAGNOSIS — I10 Essential (primary) hypertension: Secondary | ICD-10-CM | POA: Diagnosis not present

## 2017-03-16 LAB — BASIC METABOLIC PANEL
Anion gap: 9 (ref 5–15)
BUN: 13 mg/dL (ref 6–20)
CALCIUM: 9.6 mg/dL (ref 8.9–10.3)
CHLORIDE: 100 mmol/L — AB (ref 101–111)
CO2: 26 mmol/L (ref 22–32)
CREATININE: 0.56 mg/dL (ref 0.44–1.00)
GFR calc Af Amer: >60 mL/min (ref >60)
GFR calc non Af Amer: >60 mL/min (ref >60)
Glucose, Bld: 109 mg/dL — ABNORMAL HIGH (ref 65–99)
Potassium: 3.5 mmol/L (ref 3.5–5.1)
SODIUM: 135 mmol/L (ref 135–145)

## 2017-03-16 LAB — CBC WITH DIFFERENTIAL/PLATELET
BASOS PCT: 1 %
Basophils Absolute: 0 10*3/uL (ref 0.0–0.1)
EOS PCT: 1 %
Eosinophils Absolute: 0.1 10*3/uL (ref 0.0–0.7)
HEMATOCRIT: 35.6 % — AB (ref 36.0–46.0)
HEMOGLOBIN: 12.7 g/dL (ref 12.0–15.0)
Lymphocytes Relative: 17 %
Lymphs Abs: 1.3 10*3/uL (ref 0.7–4.0)
MCH: 32.5 pg (ref 26.0–34.0)
MCHC: 35.7 g/dL (ref 30.0–36.0)
MCV: 91 fL (ref 78.0–100.0)
MONOS PCT: 6 %
Monocytes Absolute: 0.5 10*3/uL (ref 0.1–1.0)
Neutro Abs: 5.8 10*3/uL (ref 1.7–7.7)
Neutrophils Relative %: 75 %
Platelets: 144 10*3/uL — ABNORMAL LOW (ref 150–400)
RBC: 3.91 MIL/uL (ref 3.87–5.11)
RDW: 12.8 % (ref 11.5–15.5)
WBC: 7.8 10*3/uL (ref 4.0–10.5)

## 2017-03-16 LAB — TROPONIN I

## 2017-03-16 NOTE — Discharge Instructions (Signed)
Taking her blood pressure medications as directed.  Follow-up with primary care doctor next 24-48 hours further evaluation.  Return the emergency Department for any chest pain, headache, dizziness, vomiting, numbness/weakness of her arms or legs or any other worsening or concerning symptoms.

## 2017-03-16 NOTE — ED Triage Notes (Signed)
Pt reports she has had difficulty regulating her BP over the past few weeks. Was seen by her PCP Tuesday and they adjusted the dose of her HCTZ. Pt reports she began feeling bad last night and felt like her heart was "pounding" so hard and she was not able to sleep last night due to the discomfort. Pt denies pain or pressure in her chest. Pt states she just does not feel well.

## 2017-03-16 NOTE — ED Provider Notes (Signed)
[place] DEPT [facility] Provider Note   CSN: [id number] Arrival date & time: 03/16/17  0957     History   Chief Complaint Chief Complaint  Patient presents with  . Palpitations    HPI Julie Wise is a 66 y.o. female with past medical history for hypertension who presents with palpitations and hypertension. Patient reports that last night she felt like her heart was beating fast and decided to check her blood pressure and noticed that it was high with systolic blood pressure in the 160s. Patient reports that she took her blood pressure medication at night but continued to be worried about her high blood pressure. Patient reports that throughout the entire night she continued to have palpitations but denies any pain in her chest. Patient reports that this morning she checked her blood pressure multiple times after taking her blood pressure medication and noted systolic blood pressure in the 170s and 180s, prompting ED visit. Patient was just seen by her primary care doctor on 9/18 where she had her Hyzaar increased. Previous to that, she had been seen in May 2018 and had her metoprolol increased from 25 mg to 50 mg. Patient does report she has a history of anxiety regarding her blood pressure and often gets very worked up when she sees high numbers. Patient was also concerned because she is getting ready to travel and does not want to have any issues when she travels. Patient denies any vision changes, chest pain, difficulty breathing, headache, numbness/weakness or arms or legs, nausea/vomiting. Patient does report a baseline history dizziness secondary vertigo.    The history is provided by the patient.    Past Medical History:  Diagnosis Date  . Adenoid cystic carcinoma ([place])   . Hypertension     There are no active problems to display for this patient.   Past Surgical History:  Procedure Laterality Date  . ABDOMINAL HYSTERECTOMY    . BREAST SURGERY      OB History    No  data available       Home Medications    Prior to Admission medications   Medication Sig Start Date End Date Taking? Authorizing Provider  hydrochlorothiazide (HYDRODIURIL) 12.5 MG tablet Take 12.5 mg by mouth daily.   Yes [provider]  losartan (COZAAR) 100 MG tablet Take 100 mg by mouth daily.   Yes [provider]  metoprolol tartrate (LOPRESSOR) 25 MG tablet Take 25 mg by mouth 2 (two) times daily.   Yes [provider]  fish oil-omega-3 fatty acids 1000 MG capsule Take 2 g by mouth daily.    [provider]  meclizine (ANTIVERT) 25 MG tablet Take 1 tablet (25 mg total) by mouth 3 (three) times daily as needed for dizziness. 01/06/15   Veryl Speak, MD  meclizine (ANTIVERT) 25 MG tablet Take 1 tablet (25 mg total) by mouth 3 (three) times daily as needed for dizziness. 08/09/15   Barrett, Lahoma Crocker, PA-C  Multiple Vitamins-Minerals (MULTIVITAMIN PO) Take by mouth.    [provider]    Family History No family history on file.  Social History Social History  Substance Use Topics  . Smoking status: Never Smoker  . Smokeless tobacco: Never Used  . Alcohol use No     Allergies   Patient has no known allergies.   Review of Systems Review of Systems  Eyes: Negative for visual disturbance.  Respiratory: Negative for shortness of breath.   Cardiovascular: Positive for palpitations. Negative for chest pain.  Gastrointestinal: Negative for abdominal pain, nausea and vomiting.  Neurological: Negative for dizziness, weakness, numbness and headaches.     Physical Exam Updated Vital Signs BP 129/79   Pulse 71   Temp 98.3 F (36.8 C) (Oral)   Resp 15   SpO2 98%   Physical Exam  Constitutional: She is oriented to person, place, and time. She appears well-developed and well-nourished.  Appears anxious but no acute distress  HENT:  Head: Normocephalic and atraumatic.  Mouth/Throat: Oropharynx is clear and moist and mucous  membranes are normal.  Eyes: Pupils are equal, round, and reactive to light. Conjunctivae, EOM and lids are normal.  Neck: Full passive range of motion without pain.  Cardiovascular: Normal rate, regular rhythm, normal heart sounds and normal pulses.  Exam reveals no gallop and no friction rub.   No murmur heard. Pulmonary/Chest: Effort normal and breath sounds normal.  Abdominal: Soft. Normal appearance. There is no tenderness. There is no rigidity and no guarding.  Musculoskeletal: Normal range of motion.  Neurological: She is alert and oriented to person, place, and time.  Cranial nerves III-XII intact Follows commands, Moves all extremities  5/5 strength to BUE and BLE  Sensation intact throughout all major nerve distributions Normal finger to nose. No dysdiadochokinesia. No pronator drift. No gait abnormalities  No slurred speech. No facial droop.   Skin: Skin is warm and dry. Capillary refill takes less than 2 seconds.  Psychiatric: She has a normal mood and affect. Her speech is normal.  Nursing note and vitals reviewed.    ED Treatments / Results  Labs (all labs ordered are listed, but only abnormal results are displayed) Labs Reviewed  BASIC METABOLIC PANEL - Abnormal; Notable for the following:       Result Value   Chloride 100 (*)    Glucose, Bld 109 (*)    All other components within normal limits  CBC WITH DIFFERENTIAL/PLATELET - Abnormal; Notable for the following:    HCT 35.6 (*)    Platelets 144 (*)    All other components within normal limits  TROPONIN I    EKG  EKG Interpretation  Date/Time:  Thursday March 16 2017 10:17:47 EDT Ventricular Rate:  87 PR Interval:    QRS Duration: 102 QT Interval:  398 QTC Calculation: 479 R Axis:   57 Text Interpretation:  Sinus rhythm RSR' in V1 or V2, right VCD or RVH Confirmed by Quintella Reichert [id number]) on 03/16/2017 10:25:59 AM Also confirmed by Quintella Reichert [phone]), editor Philomena Doheny [phone])  on  03/16/2017 10:30:25 AM       Radiology No results found.  Procedures Procedures (including critical care time)  Medications Ordered in ED Medications - No data to display   Initial Impression / Assessment and Plan / ED Course  I have reviewed the triage vital signs and the nursing notes.  Pertinent labs & imaging results that were available during my care of the patient were reviewed by me and considered in my medical decision making (see chart for details).     66 year old female with past medical history of hypertension and anxiety who presents with elevated blood pressure. Patient also reporting palpitations since last night. No chest pain, headache, vision changes, numbness/weakness of her extremity's. Patient is afebrile, non-toxic appearing, sitting comfortably on examination table. Vital signs reviewed. Patient is hypertensive at 176/96. She has taken her blood pressure medications this morning. Physical exam with no neuro deficits. Do not suspect hypertensive emergency at this time. Patient has a  long-standing history of anxiety regarding her hypertension. She was most recently seen by her primary care doctor on 03/14/17 for evaluation of her high blood pressure. At that time her Hyzaar was increased. She reports that she often will check her blood pressure at home with a home blood pressure medication machine and often becomes very anxious and worried with his is elevated. Patient reports the last few weeks her blood pressure readings have been reviewed at 301S/010X systolic prompting PCP visit 2 days ago. Symptoms likely result of patient's anxiety regarding the high blood pressure. We'll plan to check basic labs including CBC, BMP, troponin. Patient has had past episodes of hypokalemia and is on medication that makes her susceptible to hypokalemia. EKG ordered at triage.  EKG shows sinus rhythm, rate 77. Appears unchanged from February 2017 EKG. Labs reviewed. BMP is unremarkable.  Troponin is negative. CBC is unremarkable. Discussed patient with Dr. Ralene Bathe after evaluation of the patient.   Repeat vital showed blood pressure is improved. Patient still denying any chest pain, headache, vision changes, numbness/weakness of her extremities. Instructed patient to take her blood pressure medications as directed. Instructed her follow up with her primary care doctor next 24-48 hours for further evaluation. Strict return precautions discussed. Patient expresses understanding and agreement to plan.    Final Clinical Impressions(s) / ED Diagnoses   Final diagnoses:  Essential hypertension  Palpitations    New Prescriptions Discharge Medication List as of 03/16/2017 12:19 PM       Volanda Napoleon, PA-C 03/16/17 1706    Quintella Reichert, MD 03/19/17 1438

## 2017-08-23 ENCOUNTER — Other Ambulatory Visit: Payer: Self-pay

## 2017-08-23 ENCOUNTER — Encounter (HOSPITAL_BASED_OUTPATIENT_CLINIC_OR_DEPARTMENT_OTHER): Payer: Self-pay | Admitting: *Deleted

## 2017-08-23 ENCOUNTER — Emergency Department (HOSPITAL_BASED_OUTPATIENT_CLINIC_OR_DEPARTMENT_OTHER)
Admission: EM | Admit: 2017-08-23 | Discharge: 2017-08-23 | Disposition: A | Discharge status: Home / Self Care | Payer: Medicare Other | Attending: Emergency Medicine | Admitting: Emergency Medicine

## 2017-08-23 DIAGNOSIS — Z79899 Other long term (current) drug therapy: Secondary | ICD-10-CM | POA: Diagnosis not present

## 2017-08-23 DIAGNOSIS — I10 Essential (primary) hypertension: Secondary | ICD-10-CM | POA: Insufficient documentation

## 2017-08-23 DIAGNOSIS — N390 Urinary tract infection, site not specified: Secondary | ICD-10-CM | POA: Diagnosis not present

## 2017-08-23 DIAGNOSIS — M545 Low back pain: Secondary | ICD-10-CM | POA: Diagnosis present

## 2017-08-23 LAB — URINALYSIS, MICROSCOPIC (REFLEX)

## 2017-08-23 LAB — URINALYSIS, ROUTINE W REFLEX MICROSCOPIC
Bilirubin Urine: NEGATIVE
Glucose, UA: NEGATIVE mg/dL
Ketones, ur: NEGATIVE mg/dL
NITRITE: POSITIVE — AB
PROTEIN: NEGATIVE mg/dL
pH: 6 (ref 5.0–8.0)

## 2017-08-23 MED ORDER — CEPHALEXIN 500 MG PO CAPS: 500.0000 mg | ORAL_CAPSULE | Freq: Two times a day (BID) | ORAL | 0 refills | Status: AC

## 2017-08-23 NOTE — ED Provider Notes (Signed)
[facility]PART[facility] Provider Note   CSN: [id number] Arrival date & time: 08/23/17  1742     History   Chief Complaint Chief Complaint  Patient presents with  . Back Pain    HPI Julie Wise is a 67 y.o. female who presents for lower back pain and concern for flu.   HPI Patient says she was feeling well until this afternoon at work when she felt nauseated and suddenly very fatigued. She has also been having lower back pain for the last 2 days, which improves with ibuprofen. It is not worsened by any particular movements or with prolonged sitting. She is not short of breath and has no cough. She had some urinary urgency and frequency 2 weeks ago that improved after a few days with drinking cranberry juice. She denies vaginal discharge. She does not think she's had fever. She is worried about the flu because of how run down she felt all of a sudden today and because colleagues have had the flu. She denies chest pain or shortness of breath. No groin pain.    Past Medical History:  Diagnosis Date  . 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 Hypertension     There are no active problems to display for this patient.   Past Surgical History:  Procedure Laterality Date  . ABDOMINAL HYSTERECTOMY    . BREAST SURGERY      OB History    No data available       Home Medications    Prior to Admission medications   Medication Sig Start Date End Date Taking? Authorizing Provider  cephALEXin (KEFLEX) 500 MG capsule Take 1 capsule (500 mg total) by mouth 2 (two) times daily. 08/23/17   Rogue Bussing, MD  fish oil-omega-3 fatty acids 1000 MG capsule Take 2 g by mouth daily.    [provider]  hydrochlorothiazide (HYDRODIURIL) 12.5 MG tablet Take 12.5 mg by mouth daily.    [provider]  losartan (COZAAR) 100 MG tablet Take 100 mg by mouth daily.    [provider]  meclizine (ANTIVERT) 25 MG tablet Take 1 tablet (25 mg total)  by mouth 3 (three) times daily as needed for dizziness. 01/06/15   Veryl Speak, MD  meclizine (ANTIVERT) 25 MG tablet Take 1 tablet (25 mg total) by mouth 3 (three) times daily as needed for dizziness. 08/09/15   Barrett, Lahoma Crocker, PA-C  metoprolol tartrate (LOPRESSOR) 25 MG tablet Take 25 mg by mouth 2 (two) times daily.    [provider]  Multiple Vitamins-Minerals (MULTIVITAMIN PO) Take by mouth.    [provider]    Family History History reviewed. No pertinent family history.  Social History Social History   Tobacco Use  . Smoking status: Never Smoker  . Smokeless tobacco: Never Used  Substance Use Topics  . Alcohol use: No  . Drug use: No     Allergies   Patient has no known allergies.   Review of Systems Review of Systems  Constitutional: Positive for fatigue.  HENT: Negative for congestion and sore throat.   Eyes: Negative for pain.  Respiratory: Negative for cough and shortness of breath.   Cardiovascular: Negative for chest pain.  Gastrointestinal: Positive for nausea. Negative for constipation, diarrhea and vomiting.  Genitourinary: Negative for dysuria, flank pain, urgency and vaginal discharge.  Musculoskeletal: Positive for back pain. Negative for myalgias.  Skin: Negative for rash.  Neurological: Negative for dizziness and weakness.     Physical  Exam Updated Vital Signs BP (!) 166/78 (BP Location: Left Arm)   Pulse 97   Temp 98.2 F (36.8 C) (Oral)   Resp 20   Ht 5\' 7"  (1.702 m)   Wt 72.6 kg (160 lb)   SpO2 100%   BMI 25.06 kg/m   Physical Exam  Constitutional: She is oriented to person, place, and time. She appears well-developed and well-nourished. No distress.  HENT:  Head: Normocephalic and atraumatic.  Nose: Nose normal.  Mouth/Throat: Oropharynx is clear and moist.  Eyes: Conjunctivae and EOM are normal. Pupils are equal, round, and reactive to light.  Neck: Normal range of motion. Neck supple.  Cardiovascular: Normal  rate, regular rhythm and normal heart sounds.  No murmur heard. Pulmonary/Chest: Effort normal and breath sounds normal. No respiratory distress. She has no wheezes. She exhibits no tenderness.  Abdominal: Soft. Bowel sounds are normal. There is no tenderness. There is no guarding.  Musculoskeletal: Normal range of motion. She exhibits no tenderness.  Neurological: She is alert and oriented to person, place, and time.  No CVA tenderness. Patient indicates her back pain has been lower down (points to iliac crests); cannot reproduce this and no pain currently.   Skin: Skin is warm and dry.  Psychiatric: She has a normal mood and affect.  Nursing note and vitals reviewed.    ED Treatments / Results  Labs (all labs ordered are listed, but only abnormal results are displayed) Labs Reviewed  URINALYSIS, ROUTINE W REFLEX MICROSCOPIC - Abnormal; Notable for the following components:      Result Value   APPearance HAZY (*)    Specific Gravity, Urine <1.005 (*)    Hgb urine dipstick SMALL (*)    Nitrite POSITIVE (*)    Leukocytes, UA SMALL (*)    All other components within normal limits  URINALYSIS, MICROSCOPIC (REFLEX) - Abnormal; Notable for the following components:   Bacteria, UA MANY (*)    Squamous Epithelial / LPF 0-5 (*)    All other components within normal limits  URINE CULTURE    EKG  EKG Interpretation None       Radiology No results found.  Procedures Procedures (including critical care time)  Medications Ordered in ED Medications - No data to display   Initial Impression / Assessment and Plan / ED Course  I have reviewed the triage vital signs and the nursing notes.  Pertinent labs & imaging results that were available during my care of the patient were reviewed by me and considered in my medical decision making (see chart for details).  Patient well appearing with normal vital signs. No CVA tenderness to indicate pyelonephritis. Urine appears consistent  with a UTI. Will prescribe keflex BID x 5 days and obtain urine culture. Gave return precautions of inability to tolerate PO.   Final Clinical Impressions(s) / ED Diagnoses   Final diagnoses:  Lower urinary tract infectious disease    ED Discharge Orders        Ordered    cephALEXin (KEFLEX) 500 MG capsule  2 times daily     08/23/17 1850       Olene Floss [place], MD 08/23/17 1906    Margette Fast, MD 08/24/17 1500

## 2017-08-23 NOTE — ED Notes (Signed)
ED Provider at bedside. 

## 2017-08-23 NOTE — ED Triage Notes (Addendum)
Pt c/o back pain and nausea  x 2 days , urinary symptoms x 2 weeks ago

## 2017-08-23 NOTE — Discharge Instructions (Signed)
Julie Wise,  Your urine looks consistent with a urinary tract infection. This can cause nausea. Please take keflex 500 mg twice daily for 5 days. We have sent a urine culture so that if you do not have improvement we can check that the bacteria causing the infection is not resistant to the antibiotic chosen. Please seek care if you have worsening nausea or are unable to keep fluids down. Continue ibuprofen or tylenol for low back pain.

## 2017-08-26 LAB — URINE CULTURE: Culture: >=100000 — AB

## 2017-08-27 ENCOUNTER — Telehealth: Payer: Self-pay

## 2017-08-27 NOTE — Telephone Encounter (Signed)
Post ED Visit - Positive Culture Follow-up  Culture report reviewed by antimicrobial stewardship pharmacist:  []  Elenor Quinones, Pharm.D. []  Heide Guile, Pharm.D., BCPS AQ-ID []  Parks Neptune, Pharm.D., BCPS [x]  Alycia Rossetti, Pharm.D., BCPS []  [place], Pharm.D., BCPS, AAHIVP []  Legrand Como, Pharm.D., BCPS, AAHIVP []  Salome Arnt, PharmD, BCPS []  Jalene Mullet, PharmD []  Vincenza Hews, PharmD, BCPS  Positive urine culture Treated with Cephalexin, organism sensitive to the same and no further patient follow-up is required at this time.  Genia Del 08/27/2017, 10:43 AM

## 2019-08-11 ENCOUNTER — Ambulatory Visit: Payer: Medicare PPO | Attending: Internal Medicine

## 2019-08-11 DIAGNOSIS — Z23 Encounter for immunization: Secondary | ICD-10-CM

## 2019-08-11 NOTE — Progress Notes (Signed)
   Covid-19 Vaccination Clinic  Name:  Julie Wise    MRN: [id number] DOB: 11-08-1950  08/11/2019  [name] was observed post Covid-19 immunization for 15 minutes without incidence. She was provided with Vaccine Information Sheet and instruction to access the V-Safe system.   [name] was instructed to call 911 with any severe reactions post vaccine: Marland Kitchen Difficulty breathing  . Swelling of your face and throat  . A fast heartbeat  . A bad rash all over your body  . Dizziness and weakness    Immunizations Administered    Name Date Dose VIS Date Route   Pfizer COVID-19 Vaccine 08/11/2019  9:12 AM 0.3 mL 06/07/2019 Intramuscular   Manufacturer: [place]   Lot: [id number]   [place]: [id number]

## 2019-09-02 ENCOUNTER — Ambulatory Visit: Payer: Medicare PPO | Attending: Internal Medicine

## 2019-09-02 DIAGNOSIS — Z23 Encounter for immunization: Secondary | ICD-10-CM

## 2019-09-02 NOTE — Progress Notes (Signed)
   Covid-19 Vaccination Clinic  Name:  Julie Wise    MRN: [id number] DOB: Sep 02, 1950  09/02/2019  [name] was observed post Covid-19 immunization for 15 minutes without incident. She was provided with Vaccine Information Sheet and instruction to access the V-Safe system.   [name] was instructed to call 911 with any severe reactions post vaccine: Marland Kitchen Difficulty breathing  . Swelling of face and throat  . A fast heartbeat  . A bad rash all over body  . Dizziness and weakness   Immunizations Administered    Name Date Dose VIS Date Route   Pfizer COVID-19 Vaccine 09/02/2019  5:55 PM 0.3 mL 06/07/2019 Intramuscular   Manufacturer: [place]   Lot: [id number]   [place]: [id number]

## 2020-06-05 ENCOUNTER — Ambulatory Visit: Payer: Medicare PPO

## 2020-06-15 ENCOUNTER — Other Ambulatory Visit (HOSPITAL_BASED_OUTPATIENT_CLINIC_OR_DEPARTMENT_OTHER): Payer: Self-pay | Admitting: Internal Medicine

## 2020-06-15 ENCOUNTER — Ambulatory Visit: Payer: Medicare PPO | Attending: Internal Medicine

## 2020-06-15 DIAGNOSIS — Z23 Encounter for immunization: Secondary | ICD-10-CM

## 2020-06-15 NOTE — Progress Notes (Signed)
° °  Covid-19 Vaccination Clinic  Name:  Julie Wise    MRN: [id number] DOB: 1950-09-18  06/15/2020  [name] was observed post Covid-19 immunization for 15 minutes without incident. She was provided with Vaccine Information Sheet and instruction to access the V-Safe system.   [name] was instructed to call 911 with any severe reactions post vaccine:  Difficulty breathing   Swelling of face and throat   A fast heartbeat   A bad rash all over body   Dizziness and weakness   Immunizations Administered    Name Date Dose VIS Date Route   Pfizer COVID-19 Vaccine 06/15/2020  2:30 PM 0.3 mL 04/15/2020 Intramuscular   Manufacturer: [place]   Lot: [id number]   [place]: [id number]

## 2020-06-17 MED FILL — PFIZER-BIONTECH COVID-19 VA: 30 | 21 days supply | Qty: 0 | Fill #0

## 2021-03-11 ENCOUNTER — Emergency Department (HOSPITAL_BASED_OUTPATIENT_CLINIC_OR_DEPARTMENT_OTHER)
Admission: EM | Admit: 2021-03-11 | Discharge: 2021-03-11 | Disposition: A | Discharge status: Home / Self Care | Payer: Medicare PPO | Attending: Emergency Medicine | Admitting: Emergency Medicine

## 2021-03-11 ENCOUNTER — Other Ambulatory Visit: Payer: Self-pay

## 2021-03-11 ENCOUNTER — Emergency Department (HOSPITAL_BASED_OUTPATIENT_CLINIC_OR_DEPARTMENT_OTHER): Payer: Medicare PPO

## 2021-03-11 ENCOUNTER — Encounter (HOSPITAL_BASED_OUTPATIENT_CLINIC_OR_DEPARTMENT_OTHER): Payer: Self-pay | Admitting: *Deleted

## 2021-03-11 DIAGNOSIS — I10 Essential (primary) hypertension: Secondary | ICD-10-CM | POA: Insufficient documentation

## 2021-03-11 DIAGNOSIS — S63502A Unspecified sprain of left wrist, initial encounter: Secondary | ICD-10-CM | POA: Diagnosis not present

## 2021-03-11 DIAGNOSIS — W010XXA Fall on same level from slipping, tripping and stumbling without subsequent striking against object, initial encounter: Secondary | ICD-10-CM | POA: Diagnosis not present

## 2021-03-11 DIAGNOSIS — S6992XA Unspecified injury of left wrist, hand and finger(s), initial encounter: Secondary | ICD-10-CM | POA: Diagnosis present

## 2021-03-11 DIAGNOSIS — M25532 Pain in left wrist: Secondary | ICD-10-CM | POA: Diagnosis not present

## 2021-03-11 DIAGNOSIS — Z79899 Other long term (current) drug therapy: Secondary | ICD-10-CM | POA: Insufficient documentation

## 2021-03-11 NOTE — ED Notes (Signed)
D/c paperwork reviewed with pt.  No questions or concerns at time of d/c. Pt ambulatory to ED exit.

## 2021-03-11 NOTE — ED Provider Notes (Signed)
[facility] EMERGENCY DEPARTMENT Provider Note   CSN: [id number] Arrival date & time: 03/11/21  1857     History Chief Complaint  Patient presents with   Fall   Wrist Injury    Julie Wise is a 70 y.o. female.  Patient presents emergency department for evaluation of left wrist pain starting a couple of hours ago after a mechanical fall.  Patient slipped on a rug and fell onto an outstretched left arm.  She states that she has had some pain on the radial side of her wrist with extension of the wrist.  No treatments prior to arrival.  No elbow or shoulder pain.  She did not hit her head.  No treatments prior to arrival.      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   Past Surgical History:  Procedure Laterality Date   ABDOMINAL HYSTERECTOMY     BREAST SURGERY       OB History   No obstetric history on file.     No family history on file.  Social History   Tobacco Use   Smoking status: Never   Smokeless tobacco: Never  Substance Use Topics   Alcohol use: No   Drug use: No    Home Medications Prior to Admission medications   Medication Sig Start Date End Date Taking? Authorizing Provider  amLODipine (NORVASC) 5 MG tablet Take 1 tablet by mouth daily. 10/06/20  Yes [provider]  chlorthalidone (HYGROTON) 25 MG tablet TAKE 1 TABLET(25 MG) BY MOUTH DAILY 10/06/20  Yes [provider]  losartan (COZAAR) 100 MG tablet Take 100 mg by mouth daily.   Yes [provider]  cephALEXin (KEFLEX) 500 MG capsule Take 1 capsule (500 mg total) by mouth 2 (two) times daily. 08/23/17   Rogue Bussing, MD  COVID-19 mRNA vaccine, [place], 30 MCG/0.3ML injection INJECT AS DIRECTED 06/15/20 06/15/21  Carlyle Basques, MD  fish oil-omega-3 fatty acids 1000 MG capsule Take 2 g by mouth daily.    [provider]   hydrochlorothiazide (HYDRODIURIL) 12.5 MG tablet Take 12.5 mg by mouth daily.    [provider]  meclizine (ANTIVERT) 25 MG tablet Take 1 tablet (25 mg total) by mouth 3 (three) times daily as needed for dizziness. 01/06/15   Veryl Speak, MD  meclizine (ANTIVERT) 25 MG tablet Take 1 tablet (25 mg total) by mouth 3 (three) times daily as needed for dizziness. 08/09/15   Barrett, Lahoma Crocker, PA-C  metoprolol tartrate (LOPRESSOR) 25 MG tablet Take 25 mg by mouth 2 (two) times daily.    [provider]  Multiple Vitamins-Minerals (MULTIVITAMIN PO) Take by mouth.    [provider]    Allergies    Patient has no known allergies.  Review of Systems   Review of Systems  Constitutional:  Negative for activity change.  Musculoskeletal:  Positive for arthralgias. Negative for back pain, joint swelling and neck pain.  Skin:  Negative for wound.  Neurological:  Negative for weakness and numbness.   Physical Exam Updated Vital Signs BP (!) 178/90 (BP Location: Right Arm)   Pulse (!) 104   Temp 98.3 F (36.8 C) (Oral)   Resp 18   Ht '5\' 7"'$  (1.702 m)   Wt 72.6 kg   SpO2 98%   BMI 25.06 kg/m   Physical Exam Vitals and nursing note reviewed.  Constitutional:      Appearance: She is well-developed.  HENT:     Head: Normocephalic and atraumatic.  Eyes:     Pupils: Pupils are equal, round, and reactive to light.  Cardiovascular:     Pulses: Normal pulses. No decreased pulses.  Musculoskeletal:        General: Tenderness present.     Cervical back: Normal range of motion and neck supple.     Comments: Left hand: Full range of motion of the fingers.  Appear well Perfused.  Distal CMS intact.  Left wrist: Patient with poorly localized pain over the radial aspect of the wrist.  No point tenderness over the anatomic snuffbox.  Patient with full range of motion.  She reports pain with extension, however is able to do this without much difficulty.  Left elbow and forearm:  Normal exam, full range of motion.  Skin:    General: Skin is warm and dry.  Neurological:     Mental Status: She is alert.     Sensory: No sensory deficit.     Comments: Motor, sensation, and vascular distal to the injury is fully intact.   Psychiatric:        Mood and Affect: Mood normal.    ED Results / Procedures / Treatments   Labs (all labs ordered are listed, but only abnormal results are displayed) Labs Reviewed - No data to display  EKG None  Radiology DG Wrist Complete Left  Result Date: 03/11/2021 CLINICAL DATA:  Fall with wrist injury. EXAM: LEFT WRIST - COMPLETE 3+ VIEW COMPARISON:  None. FINDINGS: No acute fracture or dislocation.  Scaphoid intact. IMPRESSION: No acute osseous abnormality. Electronically Signed   By: Abigail Miyamoto M.D.   On: 03/11/2021 19:45    Procedures Procedures   Medications Ordered in ED Medications - No data to display  ED Course  I have reviewed the triage vital signs and the nursing notes.  Pertinent labs & imaging results that were available during my care of the patient were reviewed by me and considered in my medical decision making (see chart for details).  Patient seen and examined.  Patient updated on x-ray results.  Provided with wrist splint.  Encourage PCP follow-up 1 week if not improved.  Discussed rice protocol.  Vital signs reviewed and are as follows: BP (!) 178/90 (BP Location: Right Arm)   Pulse (!) 104   Temp 98.3 F (36.8 C) (Oral)   Resp 18   Ht '5\' 7"'$  (1.702 m)   Wt 72.6 kg   SpO2 98%   BMI 25.06 kg/m      MDM Rules/Calculators/A&P                           Patient with left wrist injury.  X-ray negative.  Low concern for occult scaphoid fracture at this time.  Distal CMS intact.  PCP follow-up indicated if not improving in a week.   Final Clinical Impression(s) / ED Diagnoses Final diagnoses:  Wrist sprain, left, initial encounter    Rx / DC Orders ED Discharge Orders     None         Carlisle Cater, Hershal Coria 03/11/21 2017    Tegeler, Gwenyth Allegra, MD 03/11/21 2352

## 2021-03-11 NOTE — ED Triage Notes (Signed)
She tripped over a rug and fell today. Injury to her left wrist.

## 2021-03-11 NOTE — Discharge Instructions (Signed)
Please read and follow all provided instructions.  Your diagnoses today include:  1. Wrist sprain, left, initial encounter     Tests performed today include: An x-ray of your wrist - does NOT show any broken bones Vital signs. See below for your results today.   Medications prescribed:  None  Take any prescribed medications only as directed.  Home care instructions:  Follow any educational materials contained in this packet Wear your splint for at least one week or until seen by a physician for a follow-up examination. Follow R.I.C.E. Protocol: R - rest your injury  I  - use ice on injury without applying directly to skin C - compress injury with bandage or splint E - elevate the injury above the level of your heart as much as possible to reduce pain and swelling  Follow-up instructions: Please follow-up with your primary care provider if you continue to have significant pain or trouble using your wrist in 1 week. In this case you may have a severe injury that requires further care.   Generally, when wrists are moderately tender to touch following a fall or injury, a fracture (break in bone) may be present. Because of this, even if your x-rays were normal today, it is important that you receive follow-up care as suggested (you could still have a broken bone).  Return instructions:  Please return if your fingers are numb or tingling, appear very red, white, gray or blue, or you have severe pain (also elevate wrist and loosen splint or wrap) Please return if you have difficulty moving your fingers. Please return to the Emergency Department if you experience worsening symptoms.  Please return if you have any other emergent concerns.  Additional Information:  Your vital signs today were: BP (!) 178/90 (BP Location: Right Arm)   Pulse (!) 104   Temp 98.3 F (36.8 C) (Oral)   Resp 18   Ht '5\' 7"'$  (1.702 m)   Wt 72.6 kg   SpO2 98%   BMI 25.06 kg/m  If your blood pressure (BP)  was elevated above 135/85 this visit, please have this repeated by your doctor within one month. -------------- Wrist injuries are frequent in adults and children. A sprain is an injury to the ligaments that hold your bones together. A strain is an injury to muscle or muscle tendons (cord like structure) from stretching or pulling.   Remember the importance of follow-up and possible follow-up x-rays. Improvement in pain level is not 100% insurance of not having a fracture. --------------

## 2022-07-05 IMAGING — DX DG WRIST COMPLETE 3+V*L*
4 series · 4 of 4 positions shown · non-contrast
Comparison: None.

CLINICAL DATA: Fall with wrist injury.

EXAM:
LEFT WRIST - COMPLETE 3+ VIEW

[wrist pa]
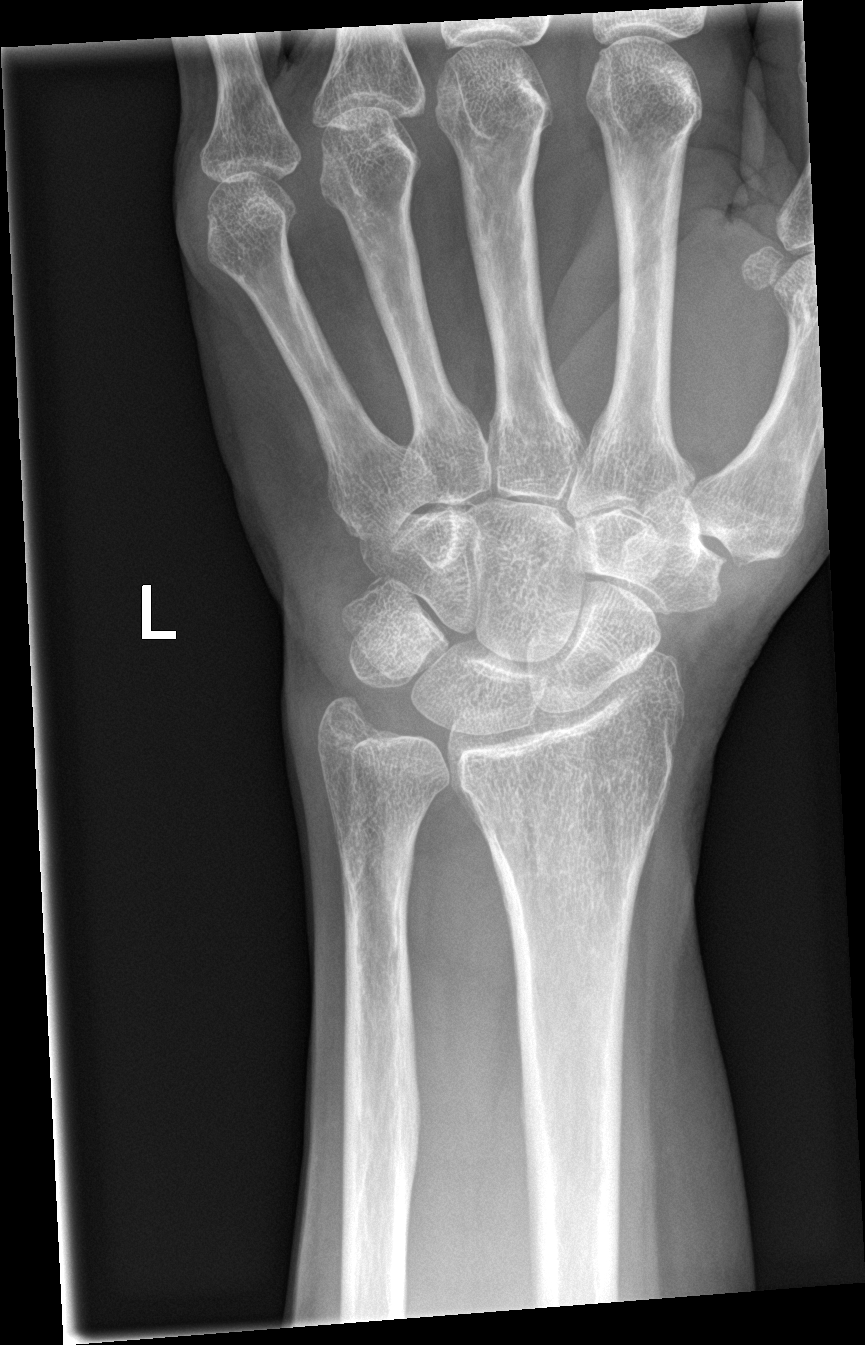

[wrist obl]
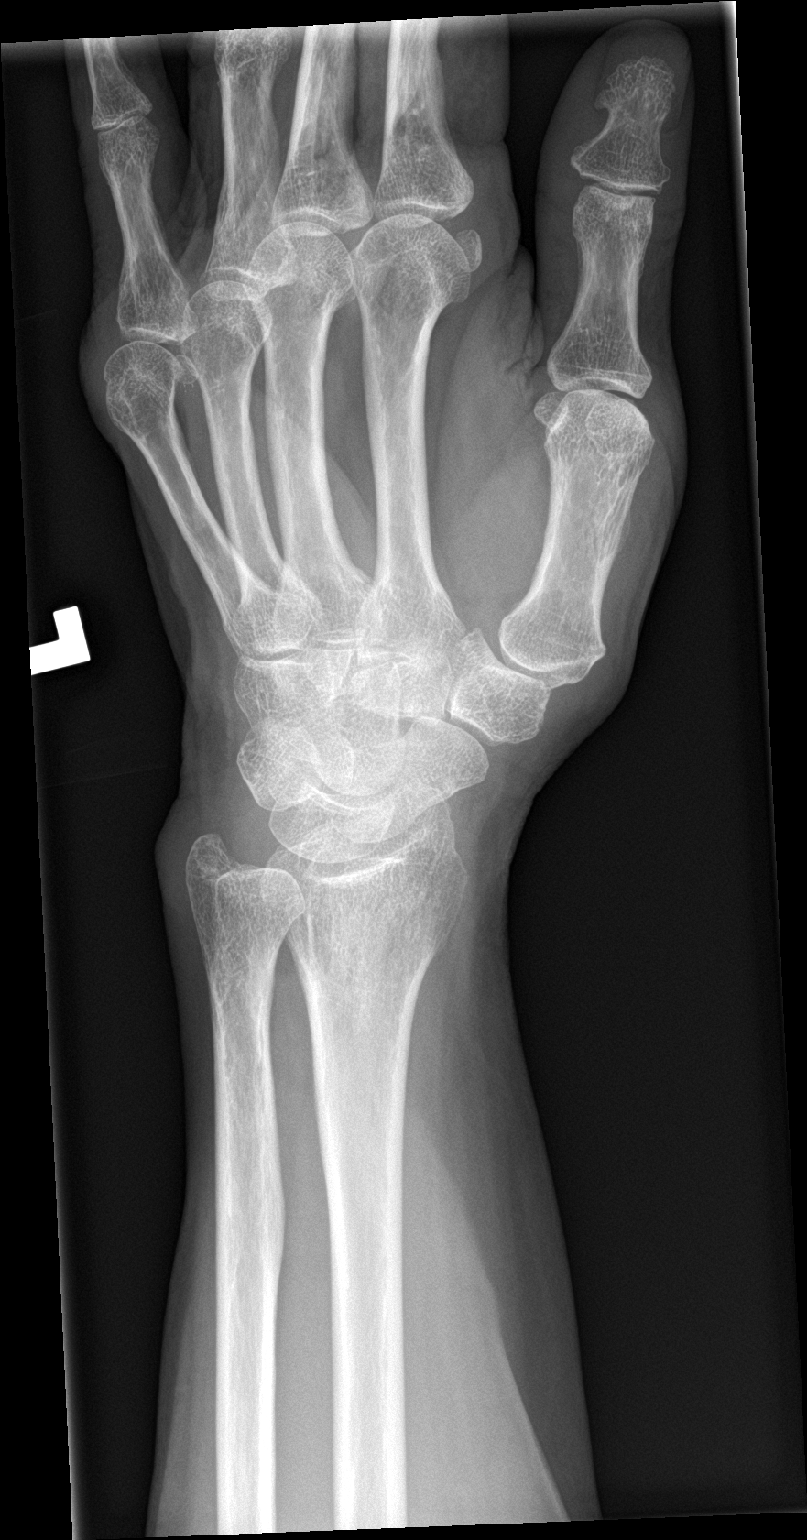

[wrist lat]
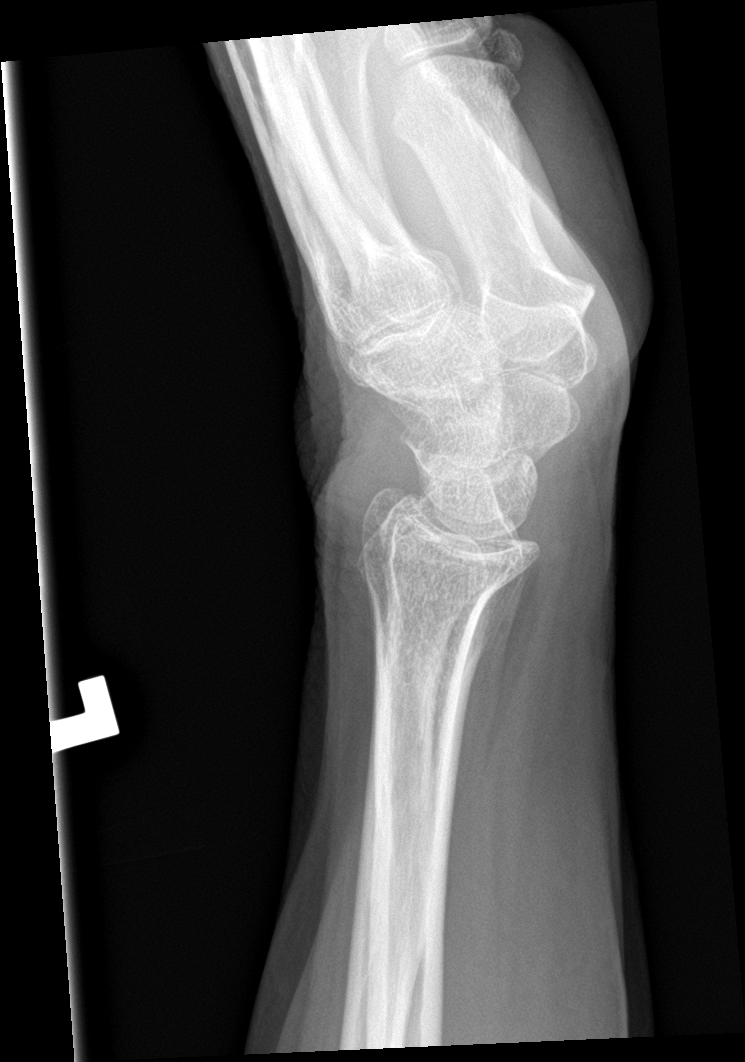

[wrist navicular]
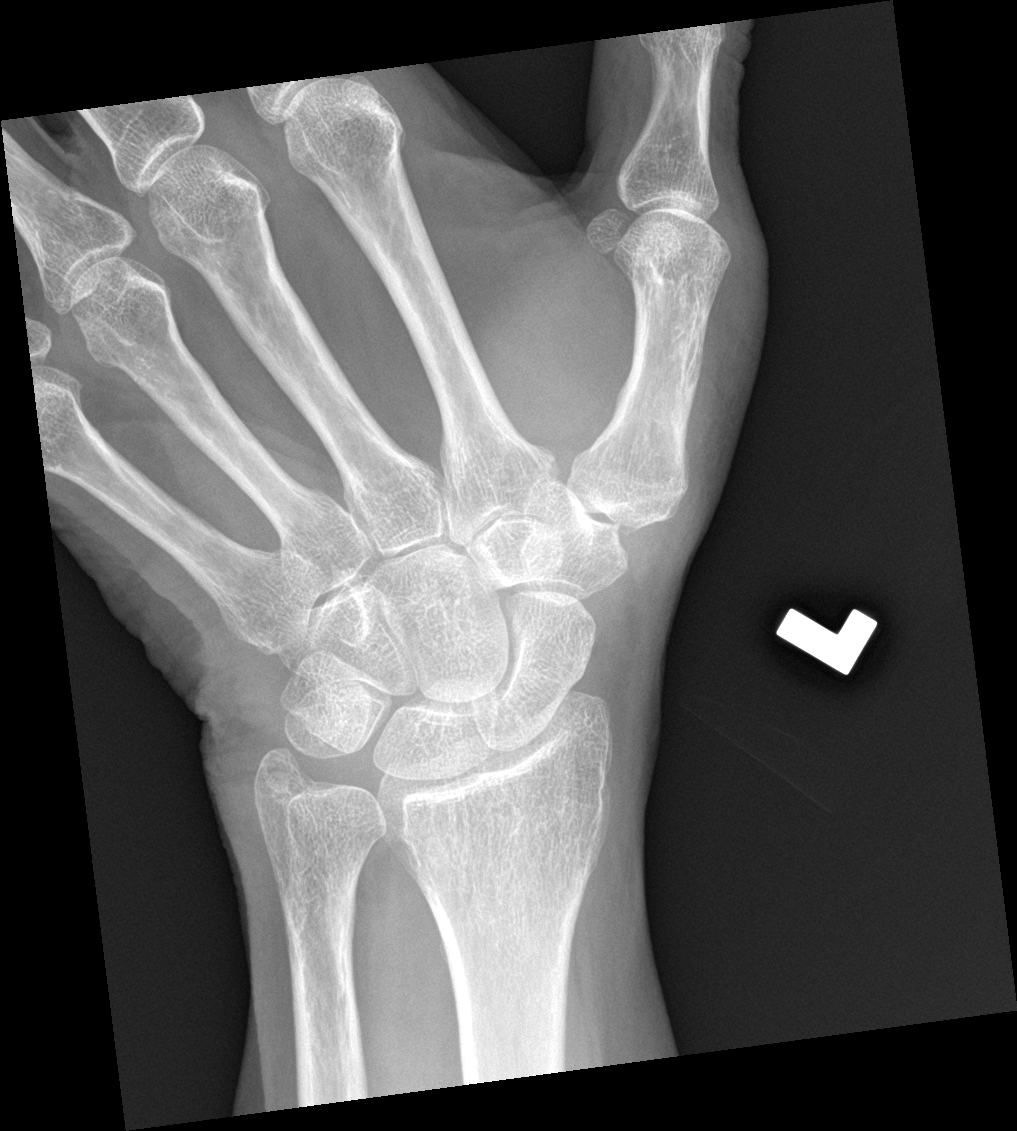

[4 of 4 positions shown; findings below may reference images not displayed]

FINDINGS: No acute fracture or dislocation.  Scaphoid intact.
IMPRESSION: No acute osseous abnormality.
